# Patient Record
Sex: Male | Born: 1985 | Race: White | Hispanic: No | Marital: Single | Smoking: Current every day smoker
Health system: Southern US, Community
[De-identification: ages and names within clinical notes are randomized; demographics above are authoritative.]

## PROBLEM LIST (undated history)

## (undated) DIAGNOSIS — R519 Headache, unspecified: Secondary | ICD-10-CM

## (undated) DIAGNOSIS — M25473 Effusion, unspecified ankle: Secondary | ICD-10-CM

---
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
## 2006-09-02 ENCOUNTER — Emergency Department: Payer: Self-pay

## 2008-11-09 IMAGING — CR RIGHT GREAT TOE
1 series · 3 of 3 positions shown · non-contrast
Comparison: none

REASON FOR EXAM: shelf landed on toe at work; [HOSPITAL]
COMMENTS:

PROCEDURE:     DXR - DXR TOE GREAT (1ST DIGIT) RT ABEBA  - September 02, 2006  [DATE]
RESULT:     No acute bony or joint abnormalities identified.

[Series 1: view not recorded · 0.17mm/px · 3 of 3 slices shown]
[im 1/3]
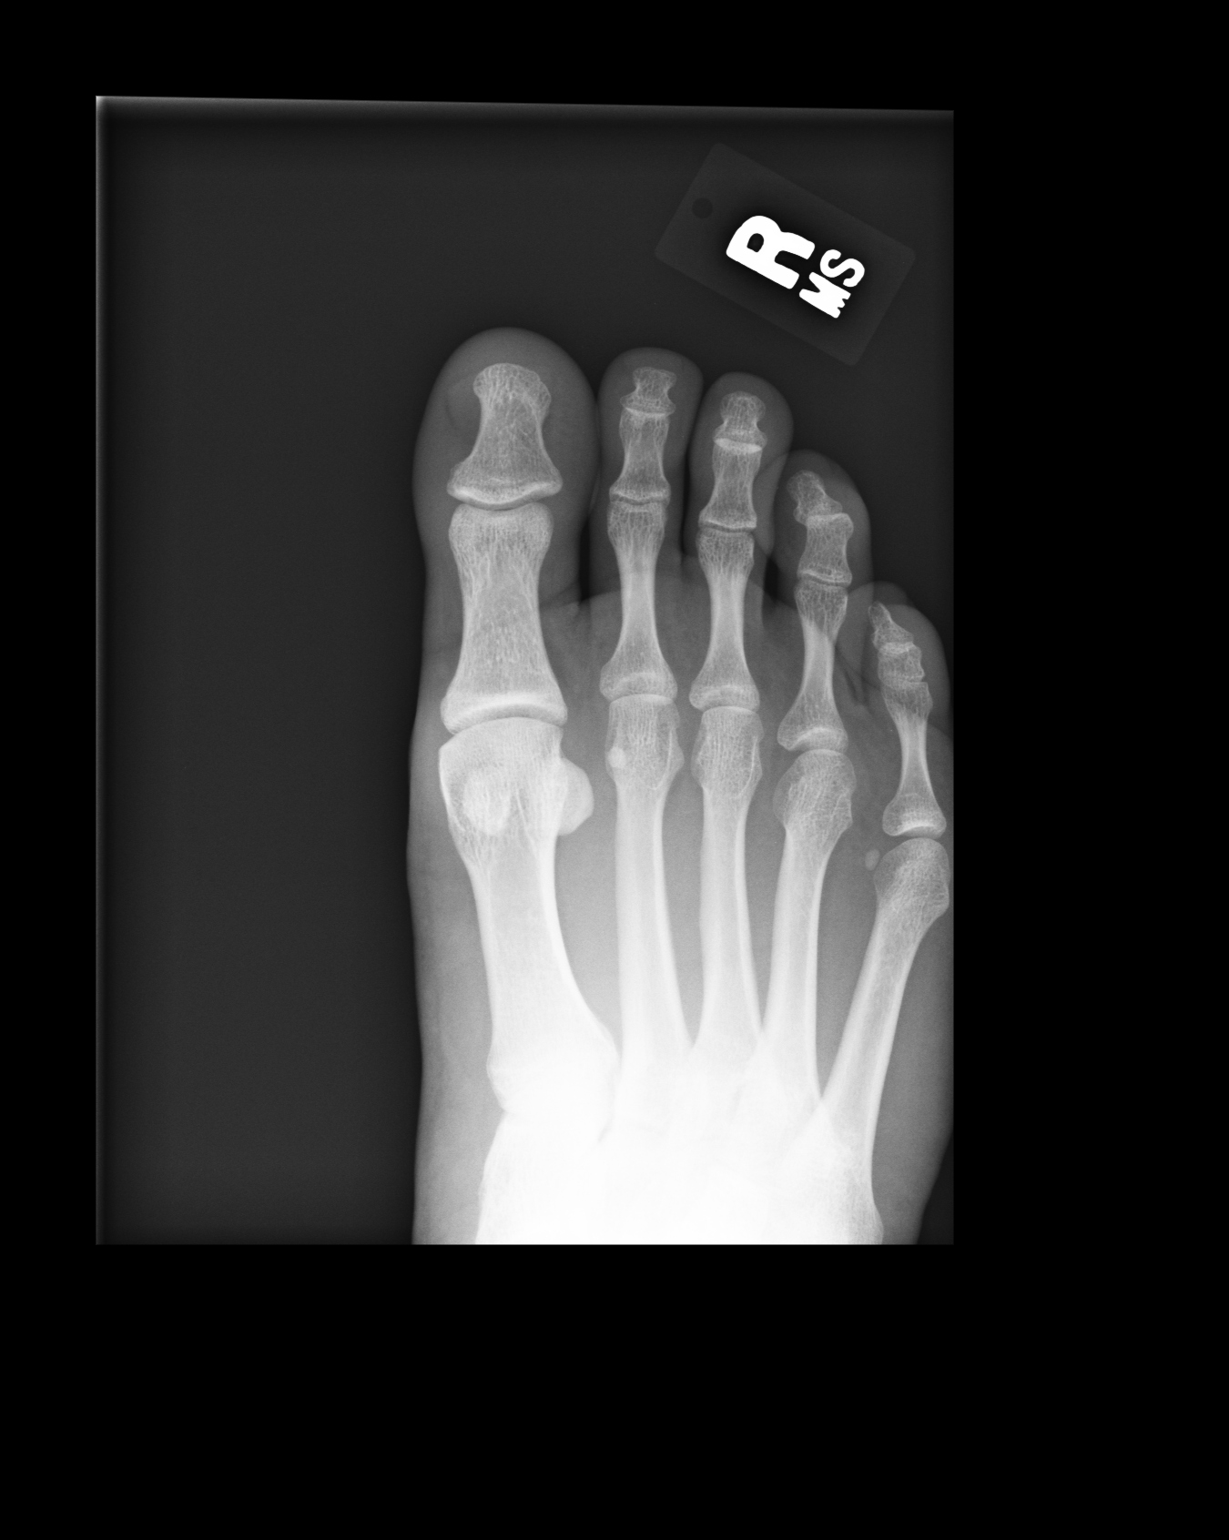
[im 2/3]
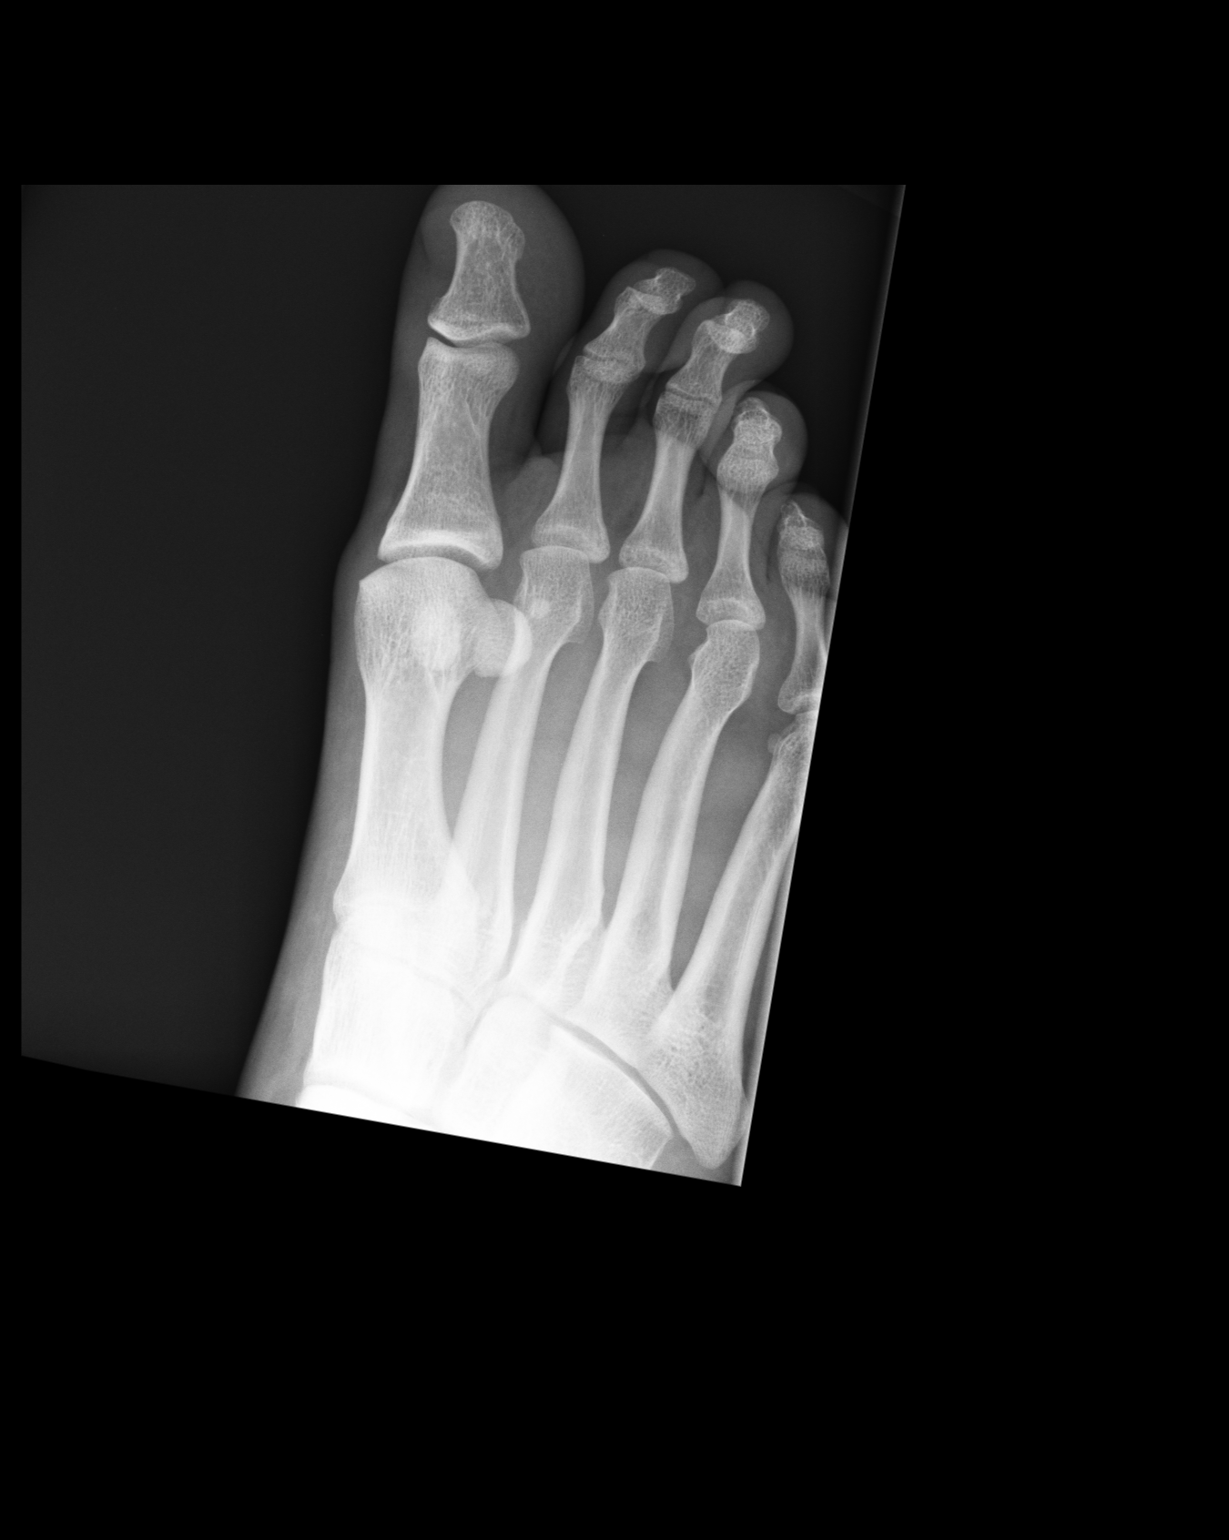
[im 3/3]
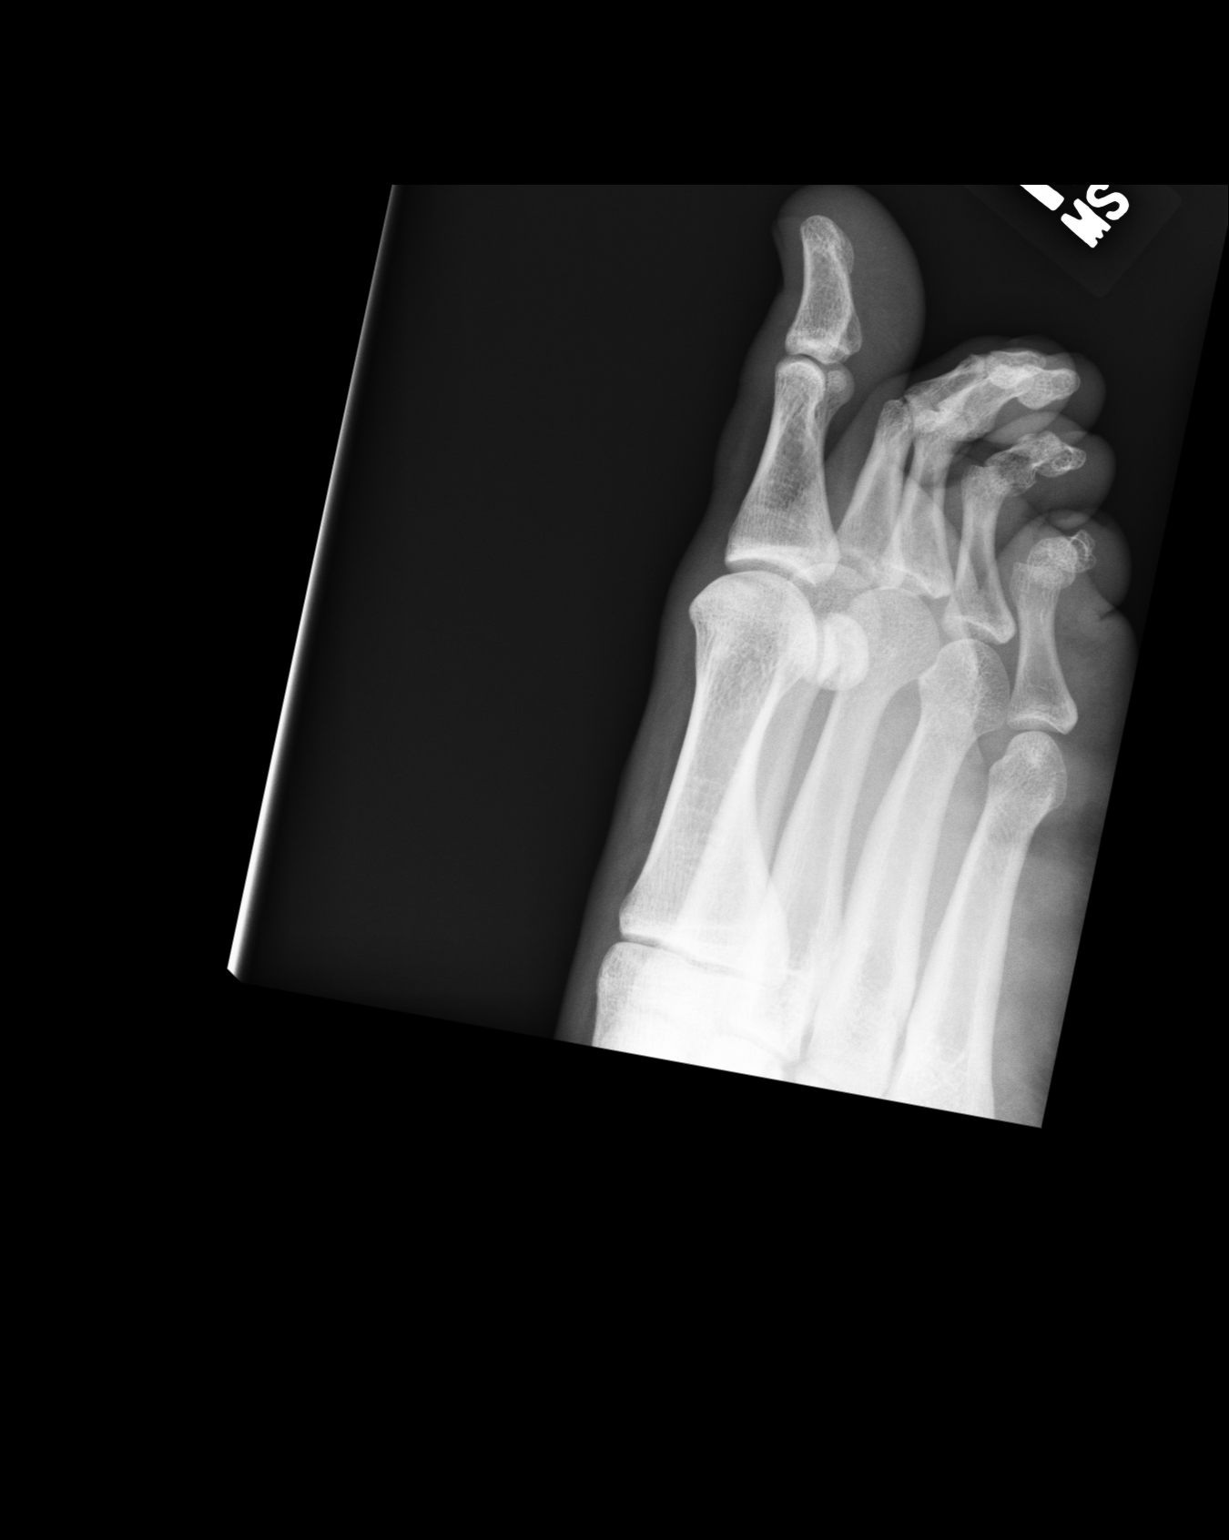

[3 of 3 positions shown; findings below may reference images not displayed]

IMPRESSION: 1)No acute abnormality.

## 2019-11-26 ENCOUNTER — Emergency Department (HOSPITAL_BASED_OUTPATIENT_CLINIC_OR_DEPARTMENT_OTHER): Admission: EM | Admit: 2019-11-26 | Discharge: 2019-11-26 | Discharge status: Absent without leave | Payer: Self-pay

## 2020-02-20 ENCOUNTER — Other Ambulatory Visit: Payer: Self-pay | Admitting: Orthopedic Surgery

## 2020-02-24 ENCOUNTER — Other Ambulatory Visit
Admission: RE | Admit: 2020-02-24 | Discharge: 2020-02-24 | Disposition: A | Discharge status: Home / Self Care | Payer: Self-pay | Source: Ambulatory Visit | Attending: Orthopedic Surgery | Admitting: Orthopedic Surgery

## 2020-02-24 ENCOUNTER — Other Ambulatory Visit: Payer: Self-pay

## 2020-02-24 DIAGNOSIS — Z01812 Encounter for preprocedural laboratory examination: Secondary | ICD-10-CM | POA: Insufficient documentation

## 2020-02-24 HISTORY — DX: Headache, unspecified: R51.9

## 2020-02-24 NOTE — Patient Instructions (Addendum)
Your procedure is scheduled on: Tuesday 03/03/20.  Report to THE FIRST FLOOR REGISTRATION DESK IN THE MEDICAL MALL ON THE MORNING OF SURGERY FIRST, THEN YOU WILL CHECK IN AT THE SURGERY INFORMATION DESK LOCATED OUTSIDE THE SAME DAY SURGERY DEPARTMENT LOCATED ON 2ND FLOOR MEDICAL MALL ENTRANCE.  To find out your arrival time please call [phone] between 1PM - 3PM on Monday 03/02/20.   Remember: Instructions that are not followed completely may result in serious medical risk, up to and including death, or upon the discretion of your surgeon and anesthesiologist your surgery may need to be rescheduled.     __X__ 1. Do not eat food after midnight the night before your procedure.                 No gum chewing or hard candies. You may drink clear liquids up to 2 hours                 before you are scheduled to arrive for your surgery- DO NOT drink clear                 liquids within 2 hours of the start of your surgery.                 Clear Liquids include:  water, apple juice without pulp, clear carbohydrate                 drink such as Clearfast or Gatorade, Black Coffee or Tea (Do not add                 milk or creamer to coffee or tea).  __X__2.  On the morning of surgery brush your teeth with toothpaste and water, you may rinse your mouth with mouthwash if you wish.  Do not swallow any toothpaste or mouthwash.    __X__ 3.  No Alcohol for 24 hours before or after surgery.  __X__ 4.  Do Not Smoke or use e-cigarettes For 24 Hours Prior to Your Surgery.                 Do not use any chewable tobacco products for at least 6 hours prior to                 surgery.  __X__5.  Notify your doctor if there is any change in your medical condition      (cold, fever, infections).      Do NOT wear jewelry, make-up, hairpins, clips or nail polish. Do NOT wear lotions, powders, or perfumes.  Do NOT shave 48 hours prior to surgery. Men may shave face and neck. Do NOT bring valuables to the  hospital.     [facility] is not responsible for any belongings or valuables.   Contacts, dentures/partials or body piercings may not be worn into surgery. Bring a case for your contacts, glasses or hearing aids, a denture cup will be supplied.    Patients discharged the day of surgery will not be allowed to drive home.     __X__ Take these medicines the morning of surgery with A SIP OF WATER:     1. cyclobenzaprine (FLEXERIL) if needed      __X__ Use CHG Soap as directed  __X__ Use inhalers on the day of surgery. Also bring the inhaler with you to the hospital on the morning of surgery.  __X__ Stop Anti-inflammatories 7 days before surgery such as Advil, Ibuprofen, Motrin, BC or Goodies  Powder, Naprosyn, Naproxen, Aleve, Aspirin, Meloxicam. May take Tylenol if needed for pain or discomfort.   __X__Do not start taking any new herbal supplements or vitamins prior to your procedure.    Wear comfortable clothing (specific to your surgery type) to the hospital.  Plan for stool softeners for home use; pain medications have a tendency to cause constipation. You can also help prevent constipation by eating foods high in fiber such as fruits and vegetables and drinking plenty of fluids as your diet allows.  After surgery, you can prevent lung complications by doing breathing exercises.Take deep breaths and cough every 1-2 hours. Your doctor may order a device called an Incentive Spirometer to help you take deep breaths.  Please call the Pre-Admissions Testing Department at [phone] if you have any questions about these instructions.

## 2020-02-28 ENCOUNTER — Other Ambulatory Visit: Admission: RE | Admit: 2020-02-28 | Payer: Self-pay | Source: Ambulatory Visit

## 2020-02-28 ENCOUNTER — Encounter (HOSPITAL_COMMUNITY): Payer: Self-pay | Admitting: Urgent Care

## 2020-03-03 ENCOUNTER — Ambulatory Visit: Admission: RE | Admit: 2020-03-03 | Payer: Self-pay | Source: Home / Self Care | Admitting: Orthopedic Surgery

## 2020-03-03 ENCOUNTER — Encounter: Admission: RE | Payer: Self-pay | Source: Home / Self Care

## 2020-03-03 SURGERY — SHOULDER ARTHROSCOPY WITH ROTATOR CUFF REPAIR AND SUBACROMIAL DECOMPRESSION
Anesthesia: General | Laterality: Right

## 2020-04-12 ENCOUNTER — Other Ambulatory Visit: Payer: Self-pay | Admitting: Orthopedic Surgery

## 2020-04-20 ENCOUNTER — Other Ambulatory Visit: Payer: Self-pay

## 2020-04-20 ENCOUNTER — Encounter
Admission: RE | Admit: 2020-04-20 | Discharge: 2020-04-20 | Disposition: A | Discharge status: Home / Self Care | Payer: Worker's Compensation | Source: Ambulatory Visit | Attending: Orthopedic Surgery | Admitting: Orthopedic Surgery

## 2020-04-20 DIAGNOSIS — Z01812 Encounter for preprocedural laboratory examination: Secondary | ICD-10-CM | POA: Insufficient documentation

## 2020-04-20 HISTORY — DX: Effusion, unspecified ankle: M25.473

## 2020-04-20 NOTE — Patient Instructions (Addendum)
Your procedure is scheduled on:04-23-20 THURSDAY Report to the Registration Desk on the 1st floor of the Medical Mall-Then proceed to the 2nd floor Surgery Desk in the Medical Mall To find out your arrival time, please call [phone] between 1PM - 3PM on:04-22-20 WEDNESDAY  REMEMBER: Instructions that are not followed completely may result in serious medical risk, up to and including death; or upon the discretion of your surgeon and anesthesiologist your surgery may need to be rescheduled.  Do not eat food after midnight the night before surgery.  No gum chewing, lozengers or hard candies.  You may however, drink CLEAR liquids up to 2 hours before you are scheduled to arrive for your surgery. Do not drink anything within 2 hours of your scheduled arrival time.  Clear liquids include: - water  - apple juice without pulp - gatorade  - black coffee or tea (Do NOT add milk or creamers to the coffee or tea) Do NOT drink anything that is not on this list.  DO NOT TAKE ANY MEDICATION THE MORNING OF SURGERY  One week prior to surgery: Stop Anti-inflammatories (NSAIDS) such as Advil, Aleve, Ibuprofen, Motrin, Naproxen, Naprosyn and Aspirin based products such as Excedrin, Goodys Powder, BC Powder-OK TO TAKE TYLENOL IF NEEDED  Stop ANY OVER THE COUNTER supplements until after surgery  No Alcohol for 24 hours before or after surgery.  No Smoking including e-cigarettes for 24 hours prior to surgery.  No chewable tobacco products for at least 6 hours prior to surgery.  No nicotine patches on the day of surgery.  Do not use any "recreational" drugs for at least a week prior to your surgery.  Please be advised that the combination of cocaine and anesthesia may have negative outcomes, up to and including death. If you test positive for cocaine, your surgery will be cancelled.  On the morning of surgery brush your teeth with toothpaste and water, you may rinse your mouth with mouthwash if you  wish. Do not swallow any toothpaste or mouthwash.  Do not wear jewelry, make-up, hairpins, clips or nail polish.  Do not wear lotions, powders, or perfumes.   Do not shave body from the neck down 48 hours prior to surgery just in case you cut yourself which could leave a site for infection.  Also, freshly shaved skin may become irritated if using the CHG soap.  Contact lenses, hearing aids and dentures may not be worn into surgery.  Do not bring valuables to the hospital. [facility] is not responsible for any missing/lost belongings or valuables.   Use CHG Soap as directed on instruction sheet.  Notify your doctor if there is any change in your medical condition (cold, fever, infection).  Wear comfortable clothing (specific to your surgery type) to the hospital.  Plan for stool softeners for home use; pain medications have a tendency to cause constipation. You can also help prevent constipation by eating foods high in fiber such as fruits and vegetables and drinking plenty of fluids as your diet allows.  After surgery, you can help prevent lung complications by doing breathing exercises.  Take deep breaths and cough every 1-2 hours. Your doctor may order a device called an Incentive Spirometer to help you take deep breaths. When coughing or sneezing, hold a pillow firmly against your incision with both hands. This is called "splinting." Doing this helps protect your incision. It also decreases belly discomfort.  If you are being admitted to the hospital overnight, leave your suitcase in  the car. After surgery it may be brought to your room.  If you are being discharged the day of surgery, you will not be allowed to drive home. You will need a responsible adult (18 years or older) to drive you home and stay with you that night.   If you are taking public transportation, you will need to have a responsible adult (18 years or older) with you. Please confirm with your physician that it  is acceptable to use public transportation.   Please call the Pre-admissions Testing Dept. at [phone] if you have any questions about these instructions.  Surgery Visitation Policy:  Patients undergoing a surgery or procedure may have one family member or support person with them as long as that person is not COVID-19 positive or experiencing its symptoms.  That person may remain in the waiting area during the procedure.  Inpatient Visitation:    Visiting hours are 7 a.m. to 8 p.m. Inpatients will be allowed two visitors daily. The visitors may change each day during the patient's stay. No visitors under the age of 33. Any visitor under the age of 59 must be accompanied by an adult. The visitor must pass COVID-19 screenings, use hand sanitizer when entering and exiting the patient's room and wear a mask at all times, including in the patient's room. Patients must also wear a mask when staff or their visitor are in the room. Masking is required regardless of vaccination status.

## 2020-04-21 ENCOUNTER — Encounter
Admission: RE | Admit: 2020-04-21 | Discharge: 2020-04-21 | Disposition: A | Discharge status: Home / Self Care | Payer: Worker's Compensation | Source: Ambulatory Visit | Attending: Orthopedic Surgery | Admitting: Orthopedic Surgery

## 2020-04-21 ENCOUNTER — Other Ambulatory Visit: Payer: Self-pay

## 2020-04-21 DIAGNOSIS — Z01812 Encounter for preprocedural laboratory examination: Secondary | ICD-10-CM | POA: Insufficient documentation

## 2020-04-21 DIAGNOSIS — Z20822 Contact with and (suspected) exposure to covid-19: Secondary | ICD-10-CM | POA: Insufficient documentation

## 2020-04-21 LAB — CBC WITH DIFFERENTIAL/PLATELET
Abs Immature Granulocytes: 0.05 10*3/uL (ref 0.00–0.07)
Basophils Absolute: 0.1 10*3/uL (ref 0.0–0.1)
Basophils Relative: 1 %
Eosinophils Absolute: 0.2 10*3/uL (ref 0.0–0.5)
Eosinophils Relative: 3 %
HCT: 47.1 % (ref 39.0–52.0)
Hemoglobin: 16.2 g/dL (ref 13.0–17.0)
Immature Granulocytes: 1 %
Lymphocytes Relative: 27 %
Lymphs Abs: 1.9 10*3/uL (ref 0.7–4.0)
MCH: 30.6 pg (ref 26.0–34.0)
MCHC: 34.4 g/dL (ref 30.0–36.0)
MCV: 88.9 fL (ref 80.0–100.0)
Monocytes Absolute: 0.6 10*3/uL (ref 0.1–1.0)
Monocytes Relative: 9 %
Neutro Abs: 4.1 10*3/uL (ref 1.7–7.7)
Neutrophils Relative %: 59 %
Platelets: 177 10*3/uL (ref 150–400)
RBC: 5.3 MIL/uL (ref 4.22–5.81)
RDW: 13.2 % (ref 11.5–15.5)
WBC: 6.9 10*3/uL (ref 4.0–10.5)
nRBC: 0 % (ref 0.0–0.2)

## 2020-04-21 LAB — BASIC METABOLIC PANEL
Anion gap: 7 (ref 5–15)
BUN: 13 mg/dL (ref 6–20)
CO2: 28 mmol/L (ref 22–32)
Calcium: 9.2 mg/dL (ref 8.9–10.3)
Chloride: 103 mmol/L (ref 98–111)
Creatinine, Ser: 0.84 mg/dL (ref 0.61–1.24)
GFR, Estimated: >60 mL/min (ref >60)
Glucose, Bld: 102 mg/dL — ABNORMAL HIGH (ref 70–99)
Potassium: 3.8 mmol/L (ref 3.5–5.1)
Sodium: 138 mmol/L (ref 135–145)

## 2020-04-21 LAB — PROTIME-INR
INR: 0.9 (ref 0.8–1.2)
Prothrombin Time: 12.2 seconds (ref 11.4–15.2)

## 2020-04-21 LAB — APTT: aPTT: 31 seconds (ref 24–36)

## 2020-04-21 LAB — SARS CORONAVIRUS 2 (TAT 6-24 HRS): SARS Coronavirus 2: NEGATIVE

## 2020-04-22 MED ORDER — ORAL CARE MOUTH RINSE: 15.0000 mL | Freq: Once | OROMUCOSAL | Status: AC

## 2020-04-22 MED ORDER — LACTATED RINGERS IV SOLN: INTRAVENOUS | Status: DC

## 2020-04-22 MED ORDER — CELECOXIB 200 MG PO CAPS
200.0000 mg | ORAL_CAPSULE | ORAL | Status: AC
  Administered 2020-04-23: 200 mg via ORAL

## 2020-04-22 MED ORDER — FAMOTIDINE 20 MG PO TABS
20.0000 mg | ORAL_TABLET | Freq: Once | ORAL | Status: AC
  Administered 2020-04-23: 20 mg via ORAL

## 2020-04-22 MED ORDER — CHLORHEXIDINE GLUCONATE CLOTH 2 % EX PADS: 6.0000 | MEDICATED_PAD | Freq: Once | CUTANEOUS | Status: DC

## 2020-04-22 MED ORDER — CHLORHEXIDINE GLUCONATE 0.12 % MT SOLN: 15.0000 mL | Freq: Once | OROMUCOSAL | Status: AC

## 2020-04-22 MED ORDER — ACETAMINOPHEN 500 MG PO TABS
1000.0000 mg | ORAL_TABLET | ORAL | Status: AC
  Administered 2020-04-23: 1000 mg via ORAL

## 2020-04-22 MED ORDER — DEXTROSE 5 % IV SOLN
3.0000 g | INTRAVENOUS | Status: AC
  Administered 2020-04-23: 3 g via INTRAVENOUS
  Filled 2020-04-22: qty 3

## 2020-04-23 ENCOUNTER — Encounter: Payer: Self-pay | Admitting: Orthopedic Surgery

## 2020-04-23 ENCOUNTER — Ambulatory Visit: Payer: Worker's Compensation

## 2020-04-23 ENCOUNTER — Other Ambulatory Visit: Payer: Self-pay

## 2020-04-23 ENCOUNTER — Encounter
Admission: RE | Disposition: A | Discharge status: Home / Self Care | Payer: Self-pay | Source: Home / Self Care | Attending: Orthopedic Surgery

## 2020-04-23 ENCOUNTER — Ambulatory Visit: Payer: Worker's Compensation | Admitting: Certified Registered Nurse Anesthetist

## 2020-04-23 ENCOUNTER — Ambulatory Visit
Admission: RE | Admit: 2020-04-23 | Discharge: 2020-04-23 | Disposition: A | Discharge status: Home / Self Care | Payer: Worker's Compensation | Attending: Orthopedic Surgery | Admitting: Orthopedic Surgery

## 2020-04-23 DIAGNOSIS — F17210 Nicotine dependence, cigarettes, uncomplicated: Secondary | ICD-10-CM | POA: Insufficient documentation

## 2020-04-23 DIAGNOSIS — Z419 Encounter for procedure for purposes other than remedying health state, unspecified: Secondary | ICD-10-CM

## 2020-04-23 DIAGNOSIS — M75111 Incomplete rotator cuff tear or rupture of right shoulder, not specified as traumatic: Secondary | ICD-10-CM | POA: Insufficient documentation

## 2020-04-23 DIAGNOSIS — M75101 Unspecified rotator cuff tear or rupture of right shoulder, not specified as traumatic: Secondary | ICD-10-CM | POA: Diagnosis present

## 2020-04-23 HISTORY — PX: SHOULDER ARTHROSCOPY WITH OPEN ROTATOR CUFF REPAIR AND DISTAL CLAVICLE ACROMINECTOMY: SHX5683

## 2020-04-23 SURGERY — SHOULDER ARTHROSCOPY WITH OPEN ROTATOR CUFF REPAIR AND DISTAL CLAVICLE ACROMINECTOMY
Anesthesia: General | Site: Shoulder | Laterality: Right

## 2020-04-23 MED ORDER — FENTANYL CITRATE (PF) 100 MCG/2ML IJ SOLN
INTRAMUSCULAR | Status: AC
  Filled 2020-04-23: qty 2

## 2020-04-23 MED ORDER — FENTANYL CITRATE (PF) 100 MCG/2ML IJ SOLN: 25.0000 ug | INTRAMUSCULAR | Status: DC | PRN

## 2020-04-23 MED ORDER — NEOMYCIN-POLYMYXIN B GU 40-200000 IR SOLN
Status: DC | PRN
  Administered 2020-04-23: 2 mL

## 2020-04-23 MED ORDER — LIDOCAINE HCL (PF) 1 % IJ SOLN
INTRAMUSCULAR | Status: DC | PRN
  Administered 2020-04-23: 3 mL via SUBCUTANEOUS

## 2020-04-23 MED ORDER — MIDAZOLAM HCL 2 MG/2ML IJ SOLN: 2.0000 mg | Freq: Once | INTRAMUSCULAR | Status: AC

## 2020-04-23 MED ORDER — LIDOCAINE HCL (PF) 1 % IJ SOLN
INTRAMUSCULAR | Status: AC
  Filled 2020-04-23: qty 30

## 2020-04-23 MED ORDER — ONDANSETRON HCL 4 MG/2ML IJ SOLN
INTRAMUSCULAR | Status: AC
  Filled 2020-04-23: qty 2

## 2020-04-23 MED ORDER — MIDAZOLAM HCL 2 MG/2ML IJ SOLN
INTRAMUSCULAR | Status: AC
  Administered 2020-04-23: 2 mg via INTRAVENOUS
  Filled 2020-04-23: qty 2

## 2020-04-23 MED ORDER — OXYCODONE HCL 5 MG PO TABS: 5.0000 mg | ORAL_TABLET | ORAL | 0 refills | Status: AC | PRN

## 2020-04-23 MED ORDER — PHENYLEPHRINE HCL (PRESSORS) 10 MG/ML IV SOLN
INTRAVENOUS | Status: DC | PRN
  Administered 2020-04-23 (×2): 100 ug via INTRAVENOUS

## 2020-04-23 MED ORDER — FENTANYL CITRATE (PF) 100 MCG/2ML IJ SOLN: 50.0000 ug | Freq: Once | INTRAMUSCULAR | Status: AC

## 2020-04-23 MED ORDER — MIDAZOLAM HCL 2 MG/2ML IJ SOLN: 1.0000 mg | Freq: Once | INTRAMUSCULAR | Status: DC

## 2020-04-23 MED ORDER — FENTANYL CITRATE (PF) 100 MCG/2ML IJ SOLN
INTRAMUSCULAR | Status: AC
  Administered 2020-04-23: 25 ug via INTRAVENOUS
  Filled 2020-04-23: qty 2

## 2020-04-23 MED ORDER — NEOMYCIN-POLYMYXIN B GU 40-200000 IR SOLN
Status: AC
  Filled 2020-04-23: qty 2

## 2020-04-23 MED ORDER — OXYCODONE HCL 5 MG PO TABS
ORAL_TABLET | ORAL | Status: AC
  Filled 2020-04-23: qty 1

## 2020-04-23 MED ORDER — ROCURONIUM BROMIDE 10 MG/ML (PF) SYRINGE
PREFILLED_SYRINGE | INTRAVENOUS | Status: AC
  Filled 2020-04-23: qty 10

## 2020-04-23 MED ORDER — SUCCINYLCHOLINE CHLORIDE 200 MG/10ML IV SOSY
PREFILLED_SYRINGE | INTRAVENOUS | Status: AC
  Filled 2020-04-23: qty 10

## 2020-04-23 MED ORDER — ROCURONIUM BROMIDE 100 MG/10ML IV SOLN
INTRAVENOUS | Status: DC | PRN
  Administered 2020-04-23: 50 mg via INTRAVENOUS
  Administered 2020-04-23: 10 mg via INTRAVENOUS
  Administered 2020-04-23: 50 mg via INTRAVENOUS
  Administered 2020-04-23: 20 mg via INTRAVENOUS

## 2020-04-23 MED ORDER — ONDANSETRON HCL 4 MG PO TABS: 4.0000 mg | ORAL_TABLET | Freq: Three times a day (TID) | ORAL | 0 refills | Status: AC | PRN

## 2020-04-23 MED ORDER — BUPIVACAINE LIPOSOME 1.3 % IJ SUSP
INTRAMUSCULAR | Status: DC | PRN
  Administered 2020-04-23: 20 mL

## 2020-04-23 MED ORDER — SUCCINYLCHOLINE CHLORIDE 20 MG/ML IJ SOLN
INTRAMUSCULAR | Status: DC | PRN
  Administered 2020-04-23: 200 mg via INTRAVENOUS

## 2020-04-23 MED ORDER — FENTANYL CITRATE (PF) 100 MCG/2ML IJ SOLN
INTRAMUSCULAR | Status: DC | PRN
  Administered 2020-04-23 (×5): 50 ug via INTRAVENOUS

## 2020-04-23 MED ORDER — CHLORHEXIDINE GLUCONATE 0.12 % MT SOLN
OROMUCOSAL | Status: AC
  Administered 2020-04-23: 15 mL via OROMUCOSAL
  Filled 2020-04-23: qty 15

## 2020-04-23 MED ORDER — ACETAMINOPHEN 500 MG PO TABS
ORAL_TABLET | ORAL | Status: AC
  Filled 2020-04-23: qty 2

## 2020-04-23 MED ORDER — OXYCODONE HCL 5 MG PO TABS
5.0000 mg | ORAL_TABLET | Freq: Once | ORAL | Status: AC
  Administered 2020-04-23: 5 mg via ORAL

## 2020-04-23 MED ORDER — CELECOXIB 200 MG PO CAPS
ORAL_CAPSULE | ORAL | Status: AC
  Filled 2020-04-23: qty 1

## 2020-04-23 MED ORDER — PROPOFOL 10 MG/ML IV BOLUS
INTRAVENOUS | Status: AC
  Filled 2020-04-23: qty 20

## 2020-04-23 MED ORDER — BUPIVACAINE HCL 0.25 % IJ SOLN
INTRAMUSCULAR | Status: DC | PRN
  Administered 2020-04-23: 15 mL

## 2020-04-23 MED ORDER — DEXMEDETOMIDINE (PRECEDEX) IN NS 20 MCG/5ML (4 MCG/ML) IV SYRINGE
PREFILLED_SYRINGE | INTRAVENOUS | Status: AC
  Filled 2020-04-23: qty 5

## 2020-04-23 MED ORDER — BUPIVACAINE HCL (PF) 0.5 % IJ SOLN
INTRAMUSCULAR | Status: AC
  Filled 2020-04-23: qty 10

## 2020-04-23 MED ORDER — ONDANSETRON HCL 4 MG/2ML IJ SOLN: 4.0000 mg | Freq: Once | INTRAMUSCULAR | Status: DC | PRN

## 2020-04-23 MED ORDER — GLYCOPYRROLATE 0.2 MG/ML IJ SOLN
INTRAMUSCULAR | Status: DC | PRN
  Administered 2020-04-23: .2 mg via INTRAVENOUS

## 2020-04-23 MED ORDER — MIDAZOLAM HCL 2 MG/2ML IJ SOLN
INTRAMUSCULAR | Status: AC
  Administered 2020-04-23: 1 mg via INTRAVENOUS
  Filled 2020-04-23: qty 2

## 2020-04-23 MED ORDER — SUGAMMADEX SODIUM 500 MG/5ML IV SOLN
INTRAVENOUS | Status: DC | PRN
  Administered 2020-04-23: 400 mg via INTRAVENOUS

## 2020-04-23 MED ORDER — LIDOCAINE HCL (CARDIAC) PF 100 MG/5ML IV SOSY
PREFILLED_SYRINGE | INTRAVENOUS | Status: DC | PRN
  Administered 2020-04-23: 100 mg via INTRAVENOUS

## 2020-04-23 MED ORDER — SUGAMMADEX SODIUM 500 MG/5ML IV SOLN
INTRAVENOUS | Status: AC
  Filled 2020-04-23: qty 5

## 2020-04-23 MED ORDER — DEXAMETHASONE SODIUM PHOSPHATE 10 MG/ML IJ SOLN
INTRAMUSCULAR | Status: DC | PRN
  Administered 2020-04-23: 10 mg via INTRAVENOUS

## 2020-04-23 MED ORDER — EPINEPHRINE PF 1 MG/ML IJ SOLN
INTRAMUSCULAR | Status: AC
  Filled 2020-04-23: qty 4

## 2020-04-23 MED ORDER — LIDOCAINE HCL (PF) 2 % IJ SOLN
INTRAMUSCULAR | Status: AC
  Filled 2020-04-23: qty 5

## 2020-04-23 MED ORDER — PROPOFOL 10 MG/ML IV BOLUS
INTRAVENOUS | Status: DC | PRN
  Administered 2020-04-23: 200 mg via INTRAVENOUS

## 2020-04-23 MED ORDER — BUPIVACAINE HCL (PF) 0.25 % IJ SOLN
INTRAMUSCULAR | Status: AC
  Filled 2020-04-23: qty 30

## 2020-04-23 MED ORDER — EPINEPHRINE PF 1 MG/ML IJ SOLN
INTRAMUSCULAR | Status: DC | PRN
  Administered 2020-04-23: 4 mL via INTRAMUSCULAR

## 2020-04-23 MED ORDER — DEXMEDETOMIDINE (PRECEDEX) IN NS 20 MCG/5ML (4 MCG/ML) IV SYRINGE
PREFILLED_SYRINGE | INTRAVENOUS | Status: DC | PRN
  Administered 2020-04-23: 4 ug via INTRAVENOUS
  Administered 2020-04-23 (×3): 8 ug via INTRAVENOUS

## 2020-04-23 MED ORDER — BUPIVACAINE HCL (PF) 0.5 % IJ SOLN
INTRAMUSCULAR | Status: DC | PRN
  Administered 2020-04-23: 10 mL via PERINEURAL

## 2020-04-23 MED ORDER — ONDANSETRON HCL 4 MG/2ML IJ SOLN
INTRAMUSCULAR | Status: DC | PRN
  Administered 2020-04-23: 4 mg via INTRAVENOUS

## 2020-04-23 MED ORDER — DEXAMETHASONE SODIUM PHOSPHATE 10 MG/ML IJ SOLN
INTRAMUSCULAR | Status: AC
  Filled 2020-04-23: qty 1

## 2020-04-23 MED ORDER — MIDAZOLAM HCL 2 MG/2ML IJ SOLN: 1.0000 mg | Freq: Once | INTRAMUSCULAR | Status: AC

## 2020-04-23 MED ORDER — BUPIVACAINE LIPOSOME 1.3 % IJ SUSP
INTRAMUSCULAR | Status: AC
  Filled 2020-04-23: qty 20

## 2020-04-23 MED ORDER — FAMOTIDINE 20 MG PO TABS
ORAL_TABLET | ORAL | Status: AC
  Filled 2020-04-23: qty 1

## 2020-04-23 MED ORDER — LIDOCAINE HCL (PF) 1 % IJ SOLN
INTRAMUSCULAR | Status: AC
  Filled 2020-04-23: qty 5

## 2020-04-23 SURGICAL SUPPLY — 70 items
ADAPTER IRRIG TUBE 2 SPIKE SOL (ADAPTER) ×4 IMPLANT
ADPR TBG 2 SPK PMP STRL ASCP (ADAPTER) ×2
ANCH SUT 5.5 KNTLS PEEK (Orthopedic Implant) ×2 IMPLANT
ANCH SUT Q-FX 2.8 (Anchor) ×1 IMPLANT
ANCHOR ALL-SUT Q-FIX 2.8 (Anchor) ×2 IMPLANT
ANCHOR SUT 5.5 MULTIFIX (Orthopedic Implant) ×4 IMPLANT
BUR RADIUS 4.0X18.5 (BURR) ×2 IMPLANT
BUR RADIUS 5.5 (BURR) ×2 IMPLANT
CANISTER SUCT LVC 12 LTR MEDI- (MISCELLANEOUS) ×2 IMPLANT
CANNULA 5.75X7 CRYSTAL CLEAR (CANNULA) ×4 IMPLANT
CANNULA PARTIAL THREAD 2X7 (CANNULA) ×2 IMPLANT
CANNULA TWIST IN 8.25X9CM (CANNULA) ×4 IMPLANT
CONNECTOR PERFECT PASSER (CONNECTOR) ×8 IMPLANT
COOLER POLAR GLACIER W/PUMP (MISCELLANEOUS) ×2 IMPLANT
COVER WAND RF STERILE (DRAPES) ×2 IMPLANT
DEVICE SUCT BLK HOLE OR FLOOR (MISCELLANEOUS) ×4 IMPLANT
DRAPE 3/4 80X56 (DRAPES) ×2 IMPLANT
DRAPE IMP U-DRAPE 54X76 (DRAPES) ×4 IMPLANT
DRAPE INCISE IOBAN 66X45 STRL (DRAPES) ×2 IMPLANT
DRAPE U-SHAPE 47X51 STRL (DRAPES) ×2 IMPLANT
DURAPREP 26ML APPLICATOR (WOUND CARE) ×6 IMPLANT
ELECT REM PT RETURN 9FT ADLT (ELECTROSURGICAL) ×2
ELECTRODE REM PT RTRN 9FT ADLT (ELECTROSURGICAL) ×1 IMPLANT
GAUZE SPONGE 4X4 12PLY STRL (GAUZE/BANDAGES/DRESSINGS) ×2 IMPLANT
GAUZE XEROFORM 1X8 LF (GAUZE/BANDAGES/DRESSINGS) ×2 IMPLANT
GLOVE SURG 9.0 ORTHO LTXF (GLOVE) ×6 IMPLANT
GLOVE SURG UNDER POLY LF SZ9 (GLOVE) ×2 IMPLANT
GOWN STRL REUS TWL 2XL XL LVL4 (GOWN DISPOSABLE) ×2 IMPLANT
GOWN STRL REUS W/ TWL LRG LVL3 (GOWN DISPOSABLE) ×1 IMPLANT
GOWN STRL REUS W/TWL LRG LVL3 (GOWN DISPOSABLE) ×2
IV LACTATED RINGER IRRG 3000ML (IV SOLUTION) ×24
IV LR IRRIG 3000ML ARTHROMATIC (IV SOLUTION) ×12 IMPLANT
KIT STABILIZATION SHOULDER (MISCELLANEOUS) ×2 IMPLANT
KIT SUTURE 2.8 Q-FIX DISP (MISCELLANEOUS) ×4 IMPLANT
KIT SUTURETAK 3.0 INSERT PERC (KITS) IMPLANT
KIT TURNOVER KIT A (KITS) ×2 IMPLANT
MANIFOLD NEPTUNE II (INSTRUMENTS) ×4 IMPLANT
MASK FACE SPIDER DISP (MASK) ×2 IMPLANT
MAT ABSORB  FLUID 56X50 GRAY (MISCELLANEOUS) ×3
MAT ABSORB FLUID 56X50 GRAY (MISCELLANEOUS) ×3 IMPLANT
NDL SAFETY ECLIPSE 18X1.5 (NEEDLE) ×1 IMPLANT
NEEDLE HYPO 18GX1.5 SHARP (NEEDLE) ×2
NEEDLE HYPO 22GX1.5 SAFETY (NEEDLE) ×2 IMPLANT
NS IRRIG 500ML POUR BTL (IV SOLUTION) ×2 IMPLANT
PACK ARTHROSCOPY SHOULDER (MISCELLANEOUS) ×2 IMPLANT
PAD ARMBOARD 7.5X6 YLW CONV (MISCELLANEOUS) ×4 IMPLANT
PAD WRAPON POLAR SHDR XLG (MISCELLANEOUS) ×1 IMPLANT
PASSER SUT FIRSTPASS SELF (INSTRUMENTS) ×2 IMPLANT
SET TUBE SUCT SHAVER OUTFL 24K (TUBING) ×2 IMPLANT
SET TUBE TIP INTRA-ARTICULAR (MISCELLANEOUS) ×2 IMPLANT
STRIP CLOSURE SKIN 1/2X4 (GAUZE/BANDAGES/DRESSINGS) ×2 IMPLANT
SUT ETHILON 4-0 (SUTURE) ×2
SUT ETHILON 4-0 FS2 18XMFL BLK (SUTURE) ×1
SUT LASSO 90 DEG SD STR (SUTURE) IMPLANT
SUT MNCRL 4-0 (SUTURE) ×2
SUT MNCRL 4-0 27XMFL (SUTURE) ×1
SUT PDS AB 0 CT1 27 (SUTURE) ×6 IMPLANT
SUT PERFECTPASSER WHITE CART (SUTURE) ×6 IMPLANT
SUT SMART STITCH CARTRIDGE (SUTURE) ×10 IMPLANT
SUT ULTRABRAID 2 COBRAID 38 (SUTURE) IMPLANT
SUT VIC AB 0 CT1 36 (SUTURE) ×6 IMPLANT
SUT VIC AB 2-0 CT2 27 (SUTURE) ×2 IMPLANT
SUTURE ETHLN 4-0 FS2 18XMF BLK (SUTURE) ×1 IMPLANT
SUTURE MNCRL 4-0 27XMF (SUTURE) ×1 IMPLANT
SYR 10ML LL (SYRINGE) ×2 IMPLANT
TAPE MICROFOAM 4IN (TAPE) ×2 IMPLANT
TUBING ARTHRO INFLOW-ONLY STRL (TUBING) ×2 IMPLANT
TUBING CONNECTING 10 (TUBING) ×2 IMPLANT
WAND HAND CNTRL MULTIVAC 90 (MISCELLANEOUS) ×2 IMPLANT
WRAPON POLAR PAD SHDR XLG (MISCELLANEOUS) ×2

## 2020-04-23 NOTE — Transfer of Care (Signed)
Immediate Anesthesia Transfer of Care Note  Patient: Philip Obrien  Procedure(s) Performed: RIGHT SHOULDER ARTHROSCOPY WITH MINI-OPEN ROTATOR CUFF REPAIR (Right Shoulder)  Patient Location: PACU  Anesthesia Type:General  Level of Consciousness: sedated  Airway & Oxygen Therapy: Patient Spontanous Breathing and Patient connected to face mask oxygen  Post-op Assessment: Report given to RN and Post -op Vital signs reviewed and stable  Post vital signs: Reviewed and stable  Last Vitals:  Vitals Value Taken Time  BP 105/58 04/23/20 1617  Temp    Pulse 83 04/23/20 1622  Resp    SpO2 93 % 04/23/20 1622  Vitals shown include unvalidated device data.  Last Pain:  Vitals:   04/23/20 1049  TempSrc: Tympanic  PainSc: 0-No pain      Patients Stated Pain Goal: 0 (04/23/20 1049)  Complications: No complications documented.

## 2020-04-23 NOTE — H&P (Signed)
PREOPERATIVE H&P  Chief Complaint: Right Shoulder Rotator Cuff Tear  HPI: Philip Obrien is a 35 y.o. male who presents for preoperative history and physical with a diagnosis of Right Shoulder Rotator Cuff Tear confirmed by MRI. Symptoms of pain, limited range of motion and weakness are significantly impairing activities of daily living.  He has agreed with surgical management.   Past Medical History:  Diagnosis Date  . Ankle swelling   . Headache    Past Surgical History:  Procedure Laterality Date  . NO PAST SURGERIES     Social History   Socioeconomic History  . Marital status: Single    Spouse name: Not on file  . Number of children: Not on file  . Years of education: Not on file  . Highest education level: Not on file  Occupational History  . Not on file  Tobacco Use  . Smoking status: Current Every Day Smoker  . Smokeless tobacco: Former Clinical biochemist  . Vaping Use: Never used  Substance and Sexual Activity  . Alcohol use: Yes    Comment: occasional  . Drug use: Never  . Sexual activity: Not on file  Other Topics Concern  . Not on file  Social History Narrative  . Not on file   Social Determinants of Health   Financial Resource Strain: Not on file  Food Insecurity: Not on file  Transportation Needs: Not on file  Physical Activity: Not on file  Stress: Not on file  Social Connections: Not on file   History reviewed. No pertinent family history. No Known Allergies Prior to Admission medications   Medication Sig Start Date End Date Taking? Authorizing Provider  cyclobenzaprine (FLEXERIL) 10 MG tablet Take 10 mg by mouth 3 (three) times daily as needed for muscle spasms.    [provider]  meloxicam (MOBIC) 7.5 MG tablet Take 7.5 mg by mouth daily as needed for pain. Patient not taking: Reported on 04/10/2020    [provider]     Positive ROS: All other systems have been reviewed and were otherwise negative with the  exception of those mentioned in the HPI and as above.  Physical Exam: General: Alert, no acute distress Cardiovascular: Regular rate and rhythm, no murmurs rubs or gallops.  No pedal edema Respiratory: Clear to auscultation bilaterally, no wheezes rales or rhonchi. No cyanosis, no use of accessory musculature GI: No organomegaly, abdomen is soft and non-tender nondistended with positive bowel sounds. Skin: Skin intact, no lesions within the operative field. Neurologic: Sensation intact distally Psychiatric: Patient is competent for consent with normal mood and affect Lymphatic: No cervical lymphadenopathy  MUSCULOSKELETAL: Patient in for elevate and abduct to proximal and 90 degrees and demonstrates weakness to shoulder abduction.  He did not demonstrate significant weakness of external rotation.  Patient is full digital cervical range of motion, intact/light touch in the palpable radial pulse.  He has positive Tinel signs but no apprehension or instability.  Assessment: Right Shoulder Rotator Cuff Tear  Plan: Plan for Procedure(s): RIGHT SHOULDER ARTHROSCOPY WITH MINI-OPEN ROTATOR CUFF REPAIR  I reviewed the details of the operation as well as the postoperative course with the patient.  Preop history and physical was performed at the bedside.  I marked the right shoulder according the hospital's correct site of surgery protocol.  I discussed the risks and benefits of surgery. The risks include but are not limited to infection, bleeding, nerve or blood vessel injury, joint stiffness or loss of motion, persistent pain,  weakness or instability, return of the rotator cuff, failure of the repair, hardware failure and the need for further surgery. Medical risks include but are not limited to DVT and pulmonary embolism, myocardial infarction, stroke, pneumonia, respiratory failure and death. Patient understood these risks and wished to proceed.     Juanell Fairly, MD   04/23/2020 12:25  PM

## 2020-04-23 NOTE — Op Note (Addendum)
04/23/2020  4:36 PM  PATIENT:  Philip Obrien  35 y.o. male  PRE-OPERATIVE DIAGNOSIS:  Right Shoulder Rotator Cuff Tear  POST-OPERATIVE DIAGNOSIS:  Right Shoulder Rotator Cuff Tear and subacromial impingement  PROCEDURE:  Procedure(s): RIGHT SHOULDER ARTHROSCOPIC SUBACROMIAL DECROMPRESSION WITH MINI-OPEN ROTATOR CUFF REPAIR (Right)  SURGEON:  Surgeon(s) and Role:    Thornton Park, MD - Primary  ANESTHESIA:   local, general and paracervical block   PREOPERATIVE INDICATIONS:  Philip Obrien is a  35 y.o. male with a diagnosis of Right Shoulder Rotator Cuff Tear, confirmed firmed by MRI, who failed conservative treatment and elected for surgical fixation.    The risks benefits and alternatives were discussed with the patient preoperatively including but not limited to the risks of infection, bleeding, nerve injury, persistent pain or weakness, shoulder stiffness/arthrofibrosis, failure of the repair, re-tear of the rotator cuff and the need for further surgery. Medical risks include DVT and pulmonary embolism, myocardial infarction, stroke, pneumonia, respiratory failure and death. Patient understood these risks and wished to proceed.  OPERATIVE IMPLANTS: [employer] Multifix anchors x 2 & [employer] Q Fix anchors x 1  OPERATIVE FINDINGS: High-grade partial-thickness articular sided tear involving the supraspinatus.  Subacromial impingement with extensive bursitis.  OPERATIVE PROCEDURE: The patient was met in the preoperative area.  A preop history and physical was performed at the bedside.  The right shoulder was signed with the word yes and my initials according the hospital's correct site of surgery protocol. Patient underwent an interscalene block with Exparel by the anesthesia service.  Patient was brought to the operating room where he underwent general anesthesia.  The patient was placed in a beachchair position.  A spider arm positioner was used for this  case.  Examination under anesthesia revealed no loss of passive range of motion or instability with load shift testing. The patient had a negative sulcus sign.  Patient was prepped and draped in a sterile fashion. A timeout was performed to verify the patient's name, date of birth, medical record number, correct site of surgery and correct procedure to be performed there was also used to verify the patient received antibiotics that all appropriate instruments, implants and radiographs studies were available in the room. Once all in attendance were in agreement case began.  Patient received ancef 3 grams IV for pre-op antibiotics.  Bony landmarks were drawn out with a surgical marker along with proposed arthroscopy incisions.  An 11 blade was used to establish a posterior portal through which the arthroscope was placed in the glenohumeral joint. A full diagnostic examination of the shoulder was performed.  Patient was found to have high-grade, articular sided partial-thickness tear of the supraspinatus.  The labrum was intact.  There is no evidence of a biceps tendon tear.  There is no subscapularis tear.  The frayed articular sided supraspinatus was debrided with a 4 oh resector shaver blade.  A 0 PDS suture was placed through the partial-thickness tear with an 18-gauge spinal needle for localization from the bursal side.  The arthroscope was then placed in the subacromial space.  Extensive bursitis was encountered and debrided using a 4-0 resector shaver blade and a 90 ArthroCare wand from a lateral portal which was established under direct visualization using an 18-gauge spinal needle. A subacromial decompression was performed sing a 5.5 mm resector shaver blade from the lateral portal.   A 4.0 mm shaver blade was used to debride the remaining intact fibers of the rotator cuff  on the bursal side using an accessory lateral portal.  A 5.5 mm resector shaver blade was then used to debride the greater  tuberosity of all torn fibers of the rotator cuff.   Two Perfect Pass sutures were placed in the lateral border of the rotator cuff tear. All arthroscopic instruments were then removed and the mini-open portion of the procedure began.  A saber-type incision was made along the lateral border of the acromion. The deltoid muscle was identified and split in line with its fibers which allowed visualization of the rotator cuff. The Perfect Pass sutures previously placed in the lateral border of the rotator cuff were brought out through the deltoid split.  2 additional perfect pass sutures were then placed in the lateral border of the rotator cuff under direct visualization.  A single [employer] anchor was placed at the articular margin of the humeral head with the greater tuberosity. The suture limbs of the Q Fix anchor was passed medially through the rotator cuff using a First Pass suture passer.   The four Perfect Pass sutures were then anchored to the greater tuberosity footprint using two [employer] Multifix anchors. The sutures passed through the Multifix anchors were then tensioned to allow reduction of the rotator cuff to the greater tuberosity footprint. The medial row repair was then performed using the Q fix sutures, which were tied down using an arthroscopic knot tying technique.  Arthroscopic images of the repair were taken with the arthroscope both externally and from inside the glenohumeral joint.  All incisions were copiously irrigated. The deltoid fascia was repaired using a 0 Vicryl suture.  The subcutaneous tissue of all incisions were closed with a 2-0 Vicryl. Skin closure for the arthroscopic incisions was performed with 4-0 nylon. The skin edges of the saber incision was approximated with a running 4-0 undyed Monocryl.  A dry sterile dressing was applied.  The patient was placed in an abduction sling and a Polar Care was applied to the shoulder.  All sharp, sponge and it  instrument counts were correct at the conclusion of the case. I was scrubbed and present for the entire case. I spoke with the patient's significant other, Philip Obrien, by phone from the PACU to let her know the case had been performed without complication and the patient was stable in recovery room.  I reviewed the postop instructions with the patient.

## 2020-04-23 NOTE — Anesthesia Preprocedure Evaluation (Addendum)
Anesthesia Evaluation  Patient identified by MRN, date of birth, ID band Patient awake    Reviewed: Allergy & Precautions, H&P , NPO status , Patient's Chart, lab work & pertinent test results, reviewed documented beta blocker date and time   Airway Mallampati: II  TM Distance: >3 FB Neck ROM: full    Dental  (+) Teeth Intact   Pulmonary neg pulmonary ROS, Current Smoker,    Pulmonary exam normal        Cardiovascular Exercise Tolerance: Good negative cardio ROS Normal cardiovascular exam Rhythm:regular Rate:Normal     Neuro/Psych  Headaches, negative psych ROS   GI/Hepatic negative GI ROS, Neg liver ROS,   Endo/Other  negative endocrine ROS  Renal/GU negative Renal ROS  negative genitourinary   Musculoskeletal   Abdominal   Peds  Hematology negative hematology ROS (+)   Anesthesia Other Findings Past Medical History: No date: Ankle swelling No date: Headache Past Surgical History: No date: NO PAST SURGERIES   Reproductive/Obstetrics negative OB ROS                             Anesthesia Physical Anesthesia Plan  ASA: II  Anesthesia Plan: General ETT   Post-op Pain Management:  Regional for Post-op pain   Induction:   PONV Risk Score and Plan:   Airway Management Planned:   Additional Equipment:   Intra-op Plan:   Post-operative Plan:   Informed Consent: I have reviewed the patients History and Physical, chart, labs and discussed the procedure including the risks, benefits and alternatives for the proposed anesthesia with the patient or authorized representative who has indicated his/her understanding and acceptance.     Dental Advisory Given  Plan Discussed with: CRNA  Anesthesia Plan Comments: (Risks and benefits of isnb discussed with patient including bleeding, infection, nerve injury and failure of block and relief. He understands these and desires to proceed  with isnb for assistance for post op pain.Marland Kitchen ja)      Anesthesia Quick Evaluation

## 2020-04-23 NOTE — Discharge Instructions (Signed)
AMBULATORY SURGERY  DISCHARGE INSTRUCTIONS   1) The drugs that you were given will stay in your system until tomorrow so for the next 24 hours you should not:  A) Drive an automobile B) Make any legal decisions C) Drink any alcoholic beverage   2) You may resume regular meals tomorrow.  Today it is better to start with liquids and gradually work up to solid foods.  You may eat anything you prefer, but it is better to start with liquids, then soup and crackers, and gradually work up to solid foods.   3) Please notify your doctor immediately if you have any unusual bleeding, trouble breathing, redness and pain at the surgery site, drainage, fever, or pain not relieved by medication.    4) Additional Instructions:        Please contact your physician with any problems or Same Day Surgery at [phone], Monday through Friday 6 am to 4 pm, or [place] at [facility] number at [phone].   Interscalene Nerve Block (ISNB) Discharge Instructions   1.  For your surgery you have received an Interscalene Nerve Block. 2. Nerve Blocks affect many types of nerves, including nerves that control movement, pain and normal sensation.  You may experience feelings such as numbness, tingling, heaviness, weakness or the inability to move your arm or the feeling or sensation that your arm has "fallen asleep". 3. A nerve block can last for 2 - 36 hours or more depending on the medication used.  Usually the weakness wears off first.  The tingling and heaviness usually wear off next.  Finally you may start to notice pain.  Keep in mind that this may occur in any order.  once a nerve block starts to wear off it is usually completely gone within 60 minutes. 4. ISNB may cause mild shortness of breath, a hoarse voice, blurry vision, unequal pupils, or drooping of the face on the same side as the nerve block.  These symptoms will usually go away within 12 hours.  Very rarely the procedure itself can  cause mild seizures. 5. If needed, your surgeon will give you a prescription for pain medication.  It will take about 60 minutes for the oral pain medication to become fully effective.  So, it is recommended that you start taking this medication before the nerve block first begins to wear off, or when you first begin to feel discomfort. 6. Keep in mind that nerve blocks often wear off in the middle of the night.   If you are going to bed and the block has not started to wear off or you have not started to have any discomfort, consider setting an alarm for 2 to 3 hours, so you can assess your block.  If you notice the block is wearing off or you are starting to have discomfort, you can take your pain medication. 7. Take your pain medication only as prescribed.  Pain medication can cause sedation and decrease your breathing if you take more than you need for the level of pain that you have. 8. Nausea is a common side effect of many pain medications.  You may want to eat something before taking your pain medicine to prevent nausea. 9. After an Interscalene nerve block, you cannot feel pain, pressure or extremes in temperature in the effected arm.  Because your arm is numb it is at an increased risk for injury.  To decrease the possibility of injury, please practice the following:  a. While you  are awake change the position of your arm frequently to prevent too much pressure on any one area for prolonged periods of time. b.  If you have a cast or tight dressing, check the color or your fingers every couple of hours.  Call your surgeon with the appearance of any discoloration (white or blue). c. If you are given a sling to wear before you go home, please wear it  at all times until the block has completely worn off.  Do not get up at night without your sling. d. If you experience any problems or concerns, please contact your surgeon's office. If you experience severe or prolonged shortness of breath go to the  nearest emergency department.   Interscalene Nerve Block with Exparel   For your surgery you have received an Interscalene Nerve Block with Exparel. Nerve Blocks affect many types of nerves, including nerves that control movement, pain and normal sensation.  You may experience feelings such as numbness, tingling, heaviness, weakness or the inability to move your arm or the feeling or sensation that your arm has "fallen asleep". A nerve block with Exparel can last up to 5 days.  Usually the weakness wears off first.  The tingling and heaviness usually wear off next.  Finally you may start to notice pain.  Keep in mind that this may occur in any order.  Once a nerve block starts to wear off it is usually completely gone within 60 minutes. ISNB may cause mild shortness of breath, a hoarse voice, blurry vision, unequal pupils, or drooping of the face on the same side as the nerve block.  These symptoms will usually resolve with the numbness.  Very rarely the procedure itself can cause mild seizures. If needed, your surgeon will give you a prescription for pain medication.  It will take about 60 minutes for the oral pain medication to become fully effective.  So, it is recommended that you start taking this medication before the nerve block first begins to wear off, or when you first begin to feel discomfort. Take your pain medication only as prescribed.  Pain medication can cause sedation and decrease your breathing if you take more than you need for the level of pain that you have. Nausea is a common side effect of many pain medications.  You may want to eat something before taking your pain medicine to prevent nausea. After an Interscalene nerve block, you cannot feel pain, pressure or extremes in temperature in the effected arm.  Because your arm is numb it is at an increased risk for injury.  To decrease the possibility of injury, please practice the following:  While you are awake change the position of  your arm frequently to prevent too much pressure on any one area for prolonged periods of time.  If you have a cast or tight dressing, check the color or your fingers every couple of hours.  Call your surgeon with the appearance of any discoloration (white or blue). If you are given a sling to wear before you go home, please wear it  at all times until the block has completely worn off.  Do not get up at night without your sling. Please contact [facility] or your surgeon if you do not begin to regain sensation after 7 days from the surgery.  Anesthesia may be contacted by calling the Same Day Surgery Department, Mon. through Fri., 6 am to 4 pm at [phone].   If you experience any other problems or  concerns, please contact your surgeon's office. e. If you experience severe or prolonged shortness of breath go to the nearest emergency department.

## 2020-04-23 NOTE — Anesthesia Procedure Notes (Addendum)
Procedure Name: Intubation Date/Time: 04/23/2020 12:32 PM Performed by: Ginger Carne, CRNA Pre-anesthesia Checklist: Patient identified, Emergency Drugs available, Suction available, Patient being monitored and Timeout performed Patient Re-evaluated:Patient Re-evaluated prior to induction Oxygen Delivery Method: Circle system utilized Preoxygenation: Pre-oxygenation with 100% oxygen Induction Type: IV induction Ventilation: Two handed mask ventilation required Laryngoscope Size: McGraph and 4 Grade View: Grade II Tube type: Oral Tube size: 8.0 mm Number of attempts: 1 Airway Equipment and Method: Stylet,  Video-laryngoscopy and Oral airway Placement Confirmation: ETT inserted through vocal cords under direct vision,  positive ETCO2 and breath sounds checked- equal and bilateral Secured at: 22 cm Tube secured with: Tape Dental Injury: Teeth and Oropharynx as per pre-operative assessment  Difficulty Due To: Difficulty was anticipated, Difficult Airway- due to large tongue, Difficult Airway- due to dentition and Difficult Airway- due to limited oral opening Future Recommendations: Recommend- induction with short-acting agent, and alternative techniques readily available

## 2020-04-23 NOTE — Anesthesia Procedure Notes (Signed)
Anesthesia Regional Block: Interscalene brachial plexus block   Pre-Anesthetic Checklist: ,, timeout performed, Correct Patient, Correct Site, Correct Laterality, Correct Procedure, Correct Position, site marked, Risks and benefits discussed,  Surgical consent,  Pre-op evaluation,  At surgeon's request and post-op pain management  Laterality: Right  Prep: chloraprep       Needles:  Injection technique: Single-shot  Needle Type: Echogenic Stimulator Needle     Needle Length: 10cm  Needle Gauge: 20     Additional Needles:   Procedures:, nerve stimulator,,, ultrasound used (permanent image in chart),,,,   Nerve Stimulator or Paresthesia:  Response: biceps flexion,   Additional Responses:   Narrative:  Start time: 04/23/2020 11:20 AM End time: 04/23/2020 11:31 AM Injection made incrementally with aspirations every 5 mL.  Performed by: Personally   Additional Notes: Functioning IV was confirmed and monitors were applied. Sterile prep and drape,hand hygiene and sterile gloves were used.  Negative aspiration and negative test dose prior to incremental administration of local anesthetic. The patient tolerated the procedure well.

## 2020-04-23 NOTE — Anesthesia Postprocedure Evaluation (Signed)
Anesthesia Post Note  Patient: Philip Obrien  Procedure(s) Performed: RIGHT SHOULDER ARTHROSCOPY WITH MINI-OPEN ROTATOR CUFF REPAIR (Right Shoulder)  Patient location during evaluation: PACU Anesthesia Type: General Level of consciousness: awake and alert Pain management: pain level controlled Vital Signs Assessment: post-procedure vital signs reviewed and stable Respiratory status: spontaneous breathing and respiratory function stable Cardiovascular status: stable Anesthetic complications: no   No complications documented.   Last Vitals:  Vitals:   04/23/20 1617 04/23/20 1630  BP: (!) 105/58 115/69  Pulse: 80 86  Resp: 12 14  Temp: 36.6 C   SpO2: 93% 94%    Last Pain:  Vitals:   04/23/20 1049  TempSrc: Tympanic  PainSc: 0-No pain                 Kurtis Anastasia K

## 2020-04-24 ENCOUNTER — Encounter: Payer: Self-pay | Admitting: Orthopedic Surgery
